# Patient Record
Sex: Male | Born: 1980 | Hispanic: Yes | Marital: Single | Smoking: Never smoker
Health system: Southern US, Community
[De-identification: ages and names within clinical notes are randomized; demographics above are authoritative.]

## PROBLEM LIST (undated history)

## (undated) DIAGNOSIS — W3400XA Accidental discharge from unspecified firearms or gun, initial encounter: Secondary | ICD-10-CM

---

## 2016-07-29 ENCOUNTER — Emergency Department (HOSPITAL_COMMUNITY)
Admission: EM | Admit: 2016-07-29 | Discharge: 2016-07-29 | Discharge status: Against Medical Advice | Payer: Self-pay | Attending: Emergency Medicine | Admitting: Emergency Medicine

## 2016-07-29 ENCOUNTER — Encounter (HOSPITAL_COMMUNITY): Payer: Self-pay | Admitting: *Deleted

## 2016-07-29 ENCOUNTER — Emergency Department (HOSPITAL_COMMUNITY): Payer: Self-pay

## 2016-07-29 DIAGNOSIS — R109 Unspecified abdominal pain: Secondary | ICD-10-CM | POA: Insufficient documentation

## 2016-07-29 DIAGNOSIS — R079 Chest pain, unspecified: Secondary | ICD-10-CM

## 2016-07-29 DIAGNOSIS — R0789 Other chest pain: Secondary | ICD-10-CM | POA: Insufficient documentation

## 2016-07-29 HISTORY — DX: Accidental discharge from unspecified firearms or gun, initial encounter: W34.00XA

## 2016-07-29 LAB — CBC
HEMATOCRIT: 45.3 % (ref 39.0–52.0)
Hemoglobin: 16.5 g/dL (ref 13.0–17.0)
MCH: 33.3 pg (ref 26.0–34.0)
MCHC: 36.4 g/dL — ABNORMAL HIGH (ref 30.0–36.0)
MCV: 91.5 fL (ref 78.0–100.0)
PLATELETS: 217 10*3/uL (ref 150–400)
RBC: 4.95 MIL/uL (ref 4.22–5.81)
RDW: 12 % (ref 11.5–15.5)
WBC: 5 10*3/uL (ref 4.0–10.5)

## 2016-07-29 LAB — BASIC METABOLIC PANEL
Anion gap: 13 (ref 5–15)
BUN: 7 mg/dL (ref 6–20)
CHLORIDE: 104 mmol/L (ref 101–111)
CO2: 22 mmol/L (ref 22–32)
CREATININE: 0.72 mg/dL (ref 0.61–1.24)
Calcium: 9.3 mg/dL (ref 8.9–10.3)
GFR calc Af Amer: >60 mL/min (ref >60)
GFR calc non Af Amer: >60 mL/min (ref >60)
Glucose, Bld: 103 mg/dL — ABNORMAL HIGH (ref 65–99)
POTASSIUM: 3.3 mmol/L — AB (ref 3.5–5.1)
Sodium: 139 mmol/L (ref 135–145)

## 2016-07-29 LAB — I-STAT TROPONIN, ED: Troponin i, poc: 0 ng/mL (ref 0.00–0.08)

## 2016-07-29 MED ORDER — MORPHINE SULFATE (PF) 4 MG/ML IV SOLN
4.0000 mg | Freq: Once | INTRAVENOUS | Status: DC
  Filled 2016-07-29: qty 1

## 2016-07-29 MED ORDER — ONDANSETRON HCL 4 MG/2ML IJ SOLN
4.0000 mg | Freq: Once | INTRAMUSCULAR | Status: DC
  Filled 2016-07-29: qty 2

## 2016-07-29 MED ORDER — SODIUM CHLORIDE 0.9 % IV BOLUS (SEPSIS): 1000.0000 mL | Freq: Once | INTRAVENOUS | Status: DC
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
## 2016-07-29 NOTE — ED Notes (Signed)
Pt decided to leave AMA as this nursing was walking in to get IV started and provide. Stating he wants to go to another hospital due to us not giving him pain meds when he inquired for them x1 hr ago.

## 2017-11-10 IMAGING — CR DG CHEST 2V
2 series · 2 of 2 positions shown · non-contrast
Comparison: None.

CLINICAL DATA: Mid chest pain tonight.

EXAM:
CHEST  2 VIEW

[chest pa]
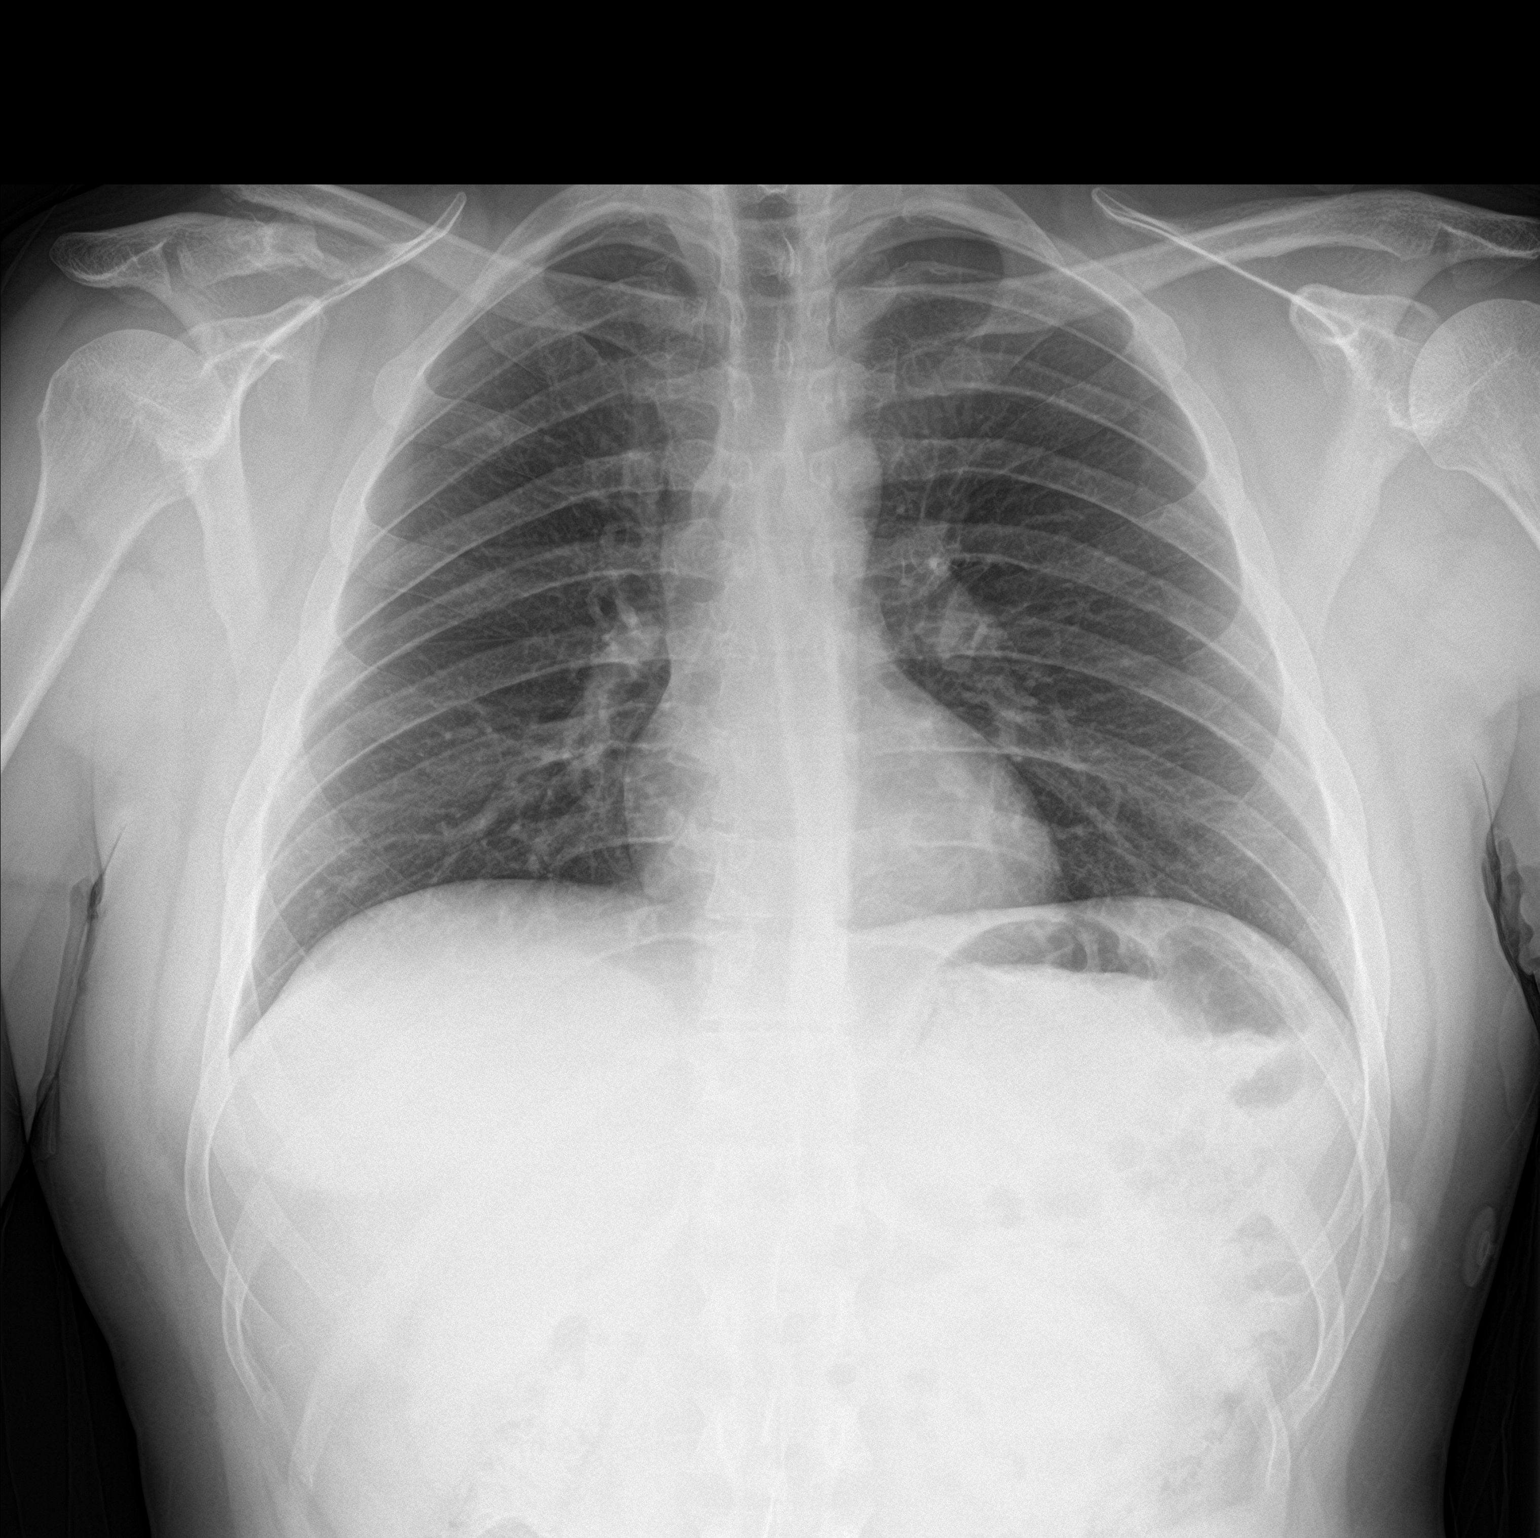

[chest lat]
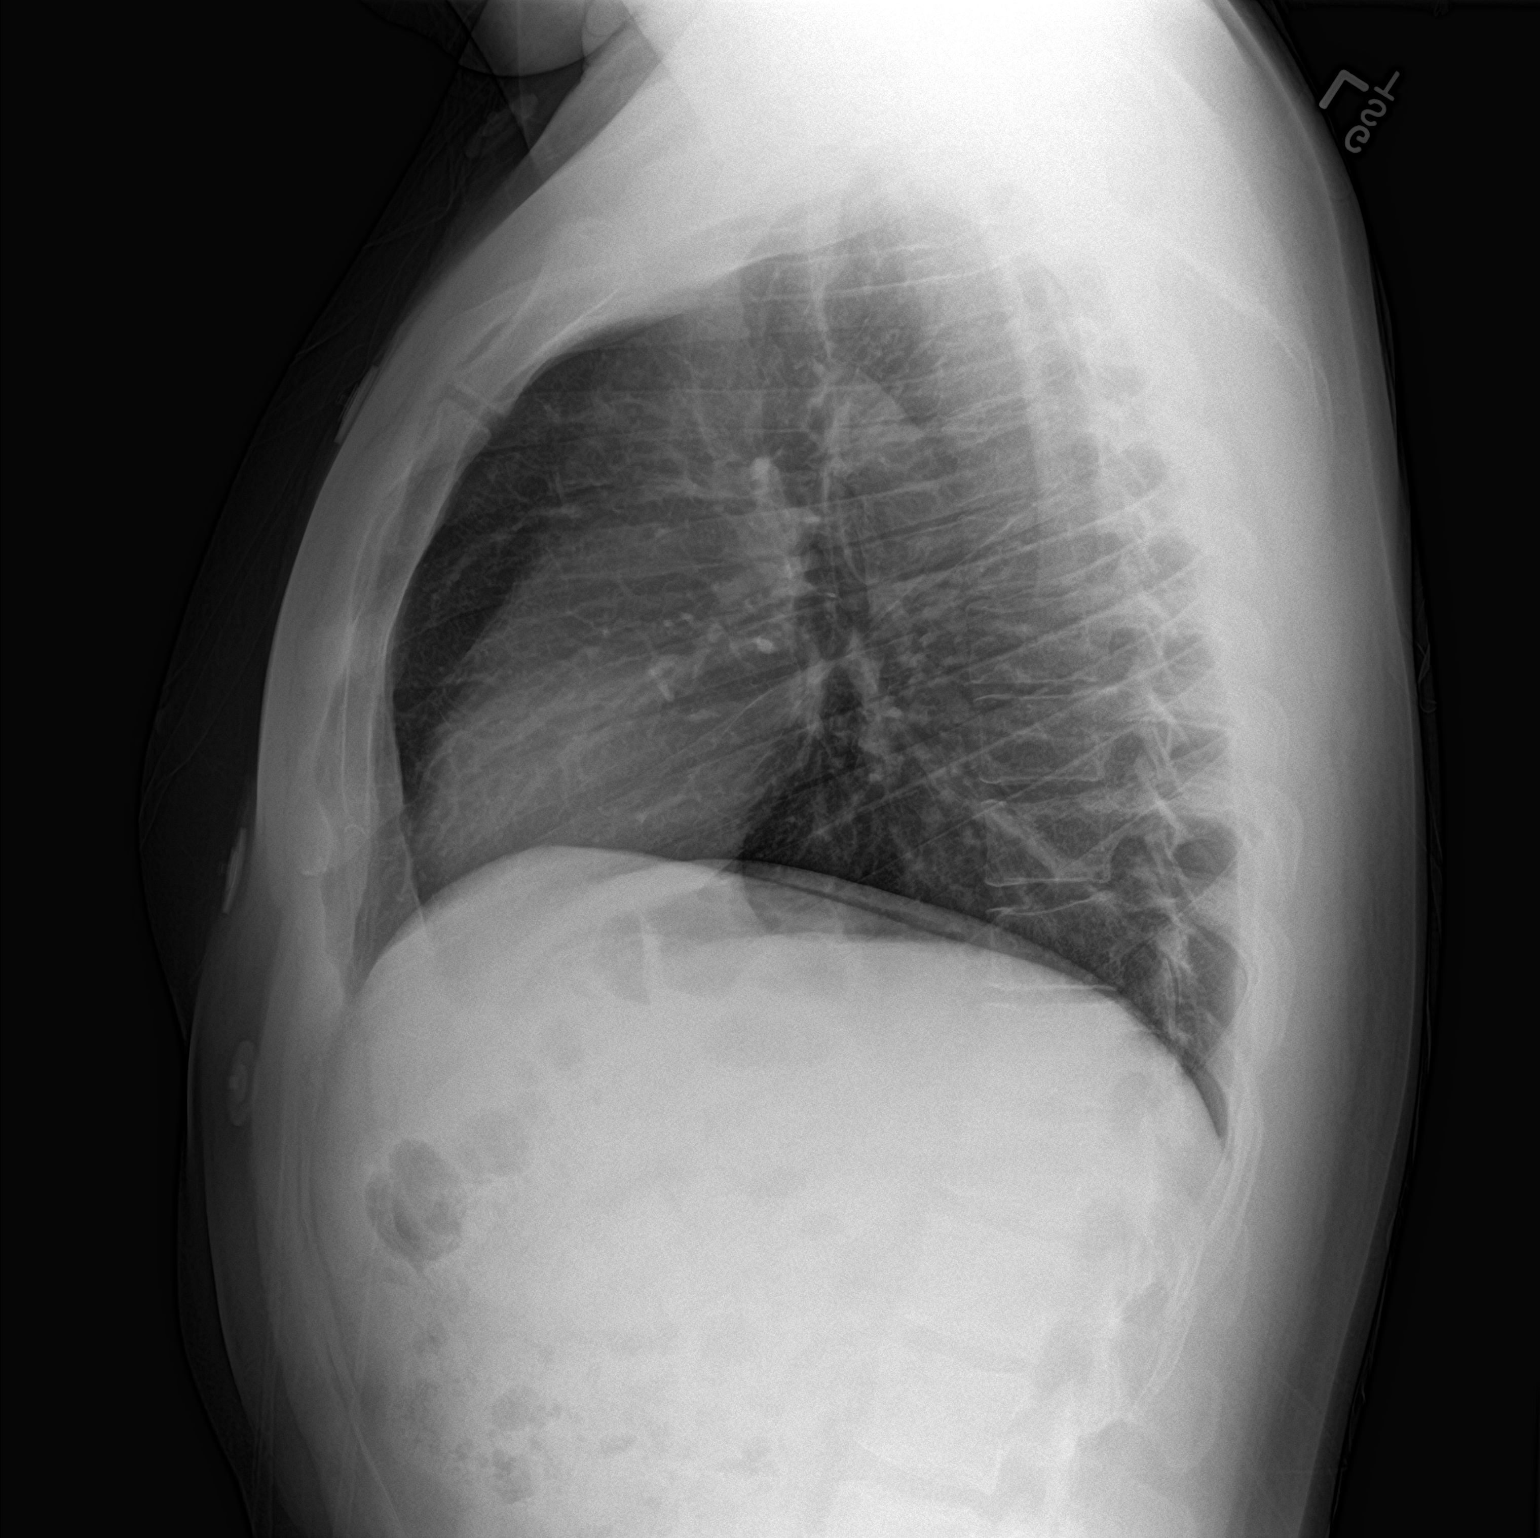

[2 of 2 positions shown; findings below may reference images not displayed]

FINDINGS: Shallow inspiration. Normal heart size and pulmonary vascularity. No
focal airspace disease or consolidation in the lungs. No blunting of
costophrenic angles. No pneumothorax. Mediastinal contours appear
intact. Old displaced fracture deformity of the distal right
clavicle.
IMPRESSION: No active cardiopulmonary disease.
